# Patient Record
Sex: Female | Born: 1970 | Race: White | Hispanic: No | Marital: Single
Health system: Southern US, Community
[De-identification: ages and names within clinical notes are randomized; demographics above are authoritative.]

---

## 2006-07-30 ENCOUNTER — Ambulatory Visit (HOSPITAL_BASED_OUTPATIENT_CLINIC_OR_DEPARTMENT_OTHER): Admission: RE | Admit: 2006-07-30 | Discharge: 2006-07-30 | Payer: Self-pay | Admitting: Orthopedic Surgery

## 2006-12-30 ENCOUNTER — Ambulatory Visit: Payer: Self-pay | Admitting: Psychology

## 2007-02-14 ENCOUNTER — Ambulatory Visit: Payer: Self-pay | Admitting: Psychology

## 2007-02-24 ENCOUNTER — Ambulatory Visit: Payer: Self-pay | Admitting: Psychology

## 2007-03-13 ENCOUNTER — Ambulatory Visit: Payer: Self-pay | Admitting: Psychology

## 2007-03-20 ENCOUNTER — Ambulatory Visit: Payer: Self-pay | Admitting: Psychology

## 2007-04-24 ENCOUNTER — Ambulatory Visit: Payer: Self-pay | Admitting: Psychology

## 2007-05-08 ENCOUNTER — Ambulatory Visit: Payer: Self-pay | Admitting: Psychology

## 2007-05-20 ENCOUNTER — Ambulatory Visit: Payer: Self-pay | Admitting: Psychology

## 2007-06-04 ENCOUNTER — Ambulatory Visit: Payer: Self-pay | Admitting: Psychology
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
## 2010-10-11 ENCOUNTER — Emergency Department (HOSPITAL_COMMUNITY)
Admission: EM | Admit: 2010-10-11 | Discharge: 2010-10-12 | Disposition: A | Discharge status: Home / Self Care | Payer: PRIVATE HEALTH INSURANCE | Attending: Emergency Medicine | Admitting: Emergency Medicine

## 2010-10-11 DIAGNOSIS — S61209A Unspecified open wound of unspecified finger without damage to nail, initial encounter: Secondary | ICD-10-CM | POA: Insufficient documentation

## 2010-10-11 DIAGNOSIS — W260XXA Contact with knife, initial encounter: Secondary | ICD-10-CM | POA: Insufficient documentation

## 2010-10-11 DIAGNOSIS — Y92009 Unspecified place in unspecified non-institutional (private) residence as the place of occurrence of the external cause: Secondary | ICD-10-CM | POA: Insufficient documentation

## 2010-10-11 DIAGNOSIS — W261XXA Contact with sword or dagger, initial encounter: Secondary | ICD-10-CM | POA: Insufficient documentation

## 2010-10-12 ENCOUNTER — Emergency Department (HOSPITAL_COMMUNITY): Payer: PRIVATE HEALTH INSURANCE
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
## 2020-10-10 ENCOUNTER — Telehealth (HOSPITAL_COMMUNITY): Payer: Self-pay | Admitting: Psychiatry

## 2020-11-08 NOTE — Telephone Encounter (Signed)
Open in error
